# Patient Record
Sex: Male | Born: 1990 | Race: Black or African American | Hispanic: No | Marital: Single | Smoking: Never smoker
Health system: Southern US, Community
[De-identification: ages and names within clinical notes are randomized; demographics above are authoritative.]

---

## 2020-02-24 ENCOUNTER — Emergency Department (HOSPITAL_COMMUNITY): Payer: Self-pay

## 2020-02-24 ENCOUNTER — Encounter (HOSPITAL_COMMUNITY): Payer: Self-pay | Admitting: Emergency Medicine

## 2020-02-24 ENCOUNTER — Other Ambulatory Visit: Payer: Self-pay

## 2020-02-24 ENCOUNTER — Emergency Department (HOSPITAL_COMMUNITY)
Admission: EM | Admit: 2020-02-24 | Discharge: 2020-02-24 | Disposition: A | Discharge status: Home / Self Care | Payer: Self-pay | Attending: Emergency Medicine | Admitting: Emergency Medicine

## 2020-02-24 DIAGNOSIS — M25572 Pain in left ankle and joints of left foot: Secondary | ICD-10-CM | POA: Insufficient documentation

## 2020-02-24 DIAGNOSIS — R030 Elevated blood-pressure reading, without diagnosis of hypertension: Secondary | ICD-10-CM

## 2020-02-24 MED ORDER — IBUPROFEN 400 MG PO TABS
400.0000 mg | ORAL_TABLET | Freq: Once | ORAL | Status: AC
  Administered 2020-02-24: 400 mg via ORAL
  Filled 2020-02-24: qty 1

## 2020-02-24 MED ORDER — IBUPROFEN 600 MG PO TABS: 600.0000 mg | ORAL_TABLET | Freq: Three times a day (TID) | ORAL | 0 refills | Status: AC | PRN

## 2020-02-24 NOTE — Discharge Instructions (Addendum)
It was our pleasure to provide your ER care today - we hope that you feel better.  Take ibuprofen as need for pain.   Follow up with primary care doctor in 1 week - also have your blood pressure rechecked then, as it is high today.  Return to ER if worse, new symptoms, fevers, increased swelling, spreading redness, severe pain, or other concern.

## 2020-02-24 NOTE — ED Triage Notes (Signed)
Pt reports bilateral ankle pain. Pt reports left more than right. Pt denies any known injury. No obvious deformity noted. Pt able to bear weight and ambulate with steady gait.

## 2020-02-24 NOTE — ED Provider Notes (Signed)
[facility] EMERGENCY DEPARTMENT Provider Note   CSN: [id number] Arrival date & time: 02/24/20  1008     History Chief Complaint  Patient presents with  . Ankle Pain    Terry Walton is a 29 y.o. male.  Patient c/o left ankle pain in the past month. Symptoms gradual onset, moderate, constant, dull, non radiating. States at times feels worse if up and walking a lot in a day. Denies specific injury. No acute/abrupt worsening today. No increased redness or swelling to area. Denies hx arthritis or gout. No other joint pain. No fever or chills. Indicates does not feel sick or ill. No fevers. No foot numbness or weakness.   The history is provided by the patient.  Ankle Pain Associated symptoms: no fever        History reviewed. No pertinent past medical history.  There are no problems to display for this patient.   History reviewed. No pertinent surgical history.     History reviewed. No pertinent family history.  Social History   Tobacco Use  . Smoking status: Never Smoker  . Smokeless tobacco: Never Used  Substance Use Topics  . Alcohol use: Yes    Comment: occ  . Drug use: Not on file    Home Medications Prior to Admission medications   Not on File    Allergies    Patient has no allergy information on record.  Review of Systems   Review of Systems  Constitutional: Negative for chills and fever.  Respiratory: Negative for shortness of breath.   Cardiovascular: Negative for chest pain.  Gastrointestinal: Negative for abdominal pain.  Genitourinary: Negative for dysuria.  Musculoskeletal:       Ankle pain  Skin: Negative for rash and wound.  Neurological: Negative for weakness and numbness.    Physical Exam Updated Vital Signs BP (!) 171/83 (BP Location: Right Arm)   Pulse 68   Temp 99.8 F (37.7 C) (Oral)   Resp 18   Ht 1.702 m (5\' 7" )   Wt 104.3 kg   SpO2 100%   BMI 36.02 kg/m   Physical Exam Vitals and nursing note reviewed.   Constitutional:      Appearance: Normal appearance. He is well-developed.  HENT:     Head: Atraumatic.     Nose: Nose normal.     Mouth/Throat:     Mouth: Mucous membranes are moist.  Eyes:     General: No scleral icterus.    Conjunctiva/sclera: Conjunctivae normal.  Neck:     Trachea: No tracheal deviation.  Cardiovascular:     Rate and Rhythm: Normal rate.     Pulses: Normal pulses.  Pulmonary:     Effort: Pulmonary effort is normal. No accessory muscle usage or respiratory distress.  Abdominal:     General: There is no distension.  Genitourinary:    Comments: No cva tenderness. Musculoskeletal:     Cervical back: Neck supple.     Comments: Tenderness left ankle and proximal foot dorsally. Dp/pt 2+. Normal cap refill distally in toes. Foot of normal color and warmth. No significant sts noted. No pain or tenderness to heel or plantar region. No proximal tib fib or lower leg pain or swelling. Skin in area appears normal.   Skin:    General: Skin is warm and dry.     Findings: No rash.  Neurological:     Mental Status: He is alert.     Comments: Alert, speech clear. Left foot nvi. Steady gait.  Psychiatric:        Mood and Affect: Mood normal.     ED Results / Procedures / Treatments   Labs (all labs ordered are listed, but only abnormal results are displayed) Labs Reviewed - No data to display  EKG None  Radiology DG Ankle Complete Left  Result Date: 02/24/2020 CLINICAL DATA:  29 year old complaining of LEFT ankle pain. No known injuries. EXAM: LEFT ANKLE COMPLETE - 3+ VIEW COMPARISON:  None. FINDINGS: No evidence of acute fracture. Tibiotalar joint anatomically aligned with well-preserved joint space. Well-preserved bone mineral density. Very small enthesopathic spur at the insertion of the Achilles tendon on the POSTERIOR calcaneus. No visible joint effusion. IMPRESSION: No acute or significant osseous abnormality. Electronically Signed   By: Hulan Saas M.D.    On: 02/24/2020 12:10    Procedures Procedures (including critical care time)  Medications Ordered in ED Medications  ibuprofen (ADVIL) tablet 400 mg (has no administration in time range)    ED Course  I have reviewed the triage vital signs and the nursing notes.  Pertinent labs & imaging results that were available during my care of the patient were reviewed by me and considered in my medical decision making (see chart for details).    MDM Rules/Calculators/A&P                          Xrays.   No meds pta today. Ibuprofen po.   Reviewed nursing notes and prior charts for additional history.   Xrays reviewed/interpreted by me - no fx. Discussed w pt. On exam, no redness, no increased warmth, no pain w passive rom at ankle.   Pt appears stable for d/c.  RX nsaid.   Rec pcp f/u, as well as for recheck bp as high today.  Return precautions provided.    Final Clinical Impression(s) / ED Diagnoses Final diagnoses:  None    Rx / DC Orders ED Discharge Orders    None       Cathren Laine, MD 02/24/20 1224

## 2022-05-07 IMAGING — DX DG ANKLE COMPLETE 3+V*L*
3 series · 3 of 3 positions shown · non-contrast
Comparison: None.

CLINICAL DATA: 29-year-old complaining of LEFT ankle pain. No known
injuries.

EXAM:
LEFT ANKLE COMPLETE - 3+ VIEW

[ankle ap]
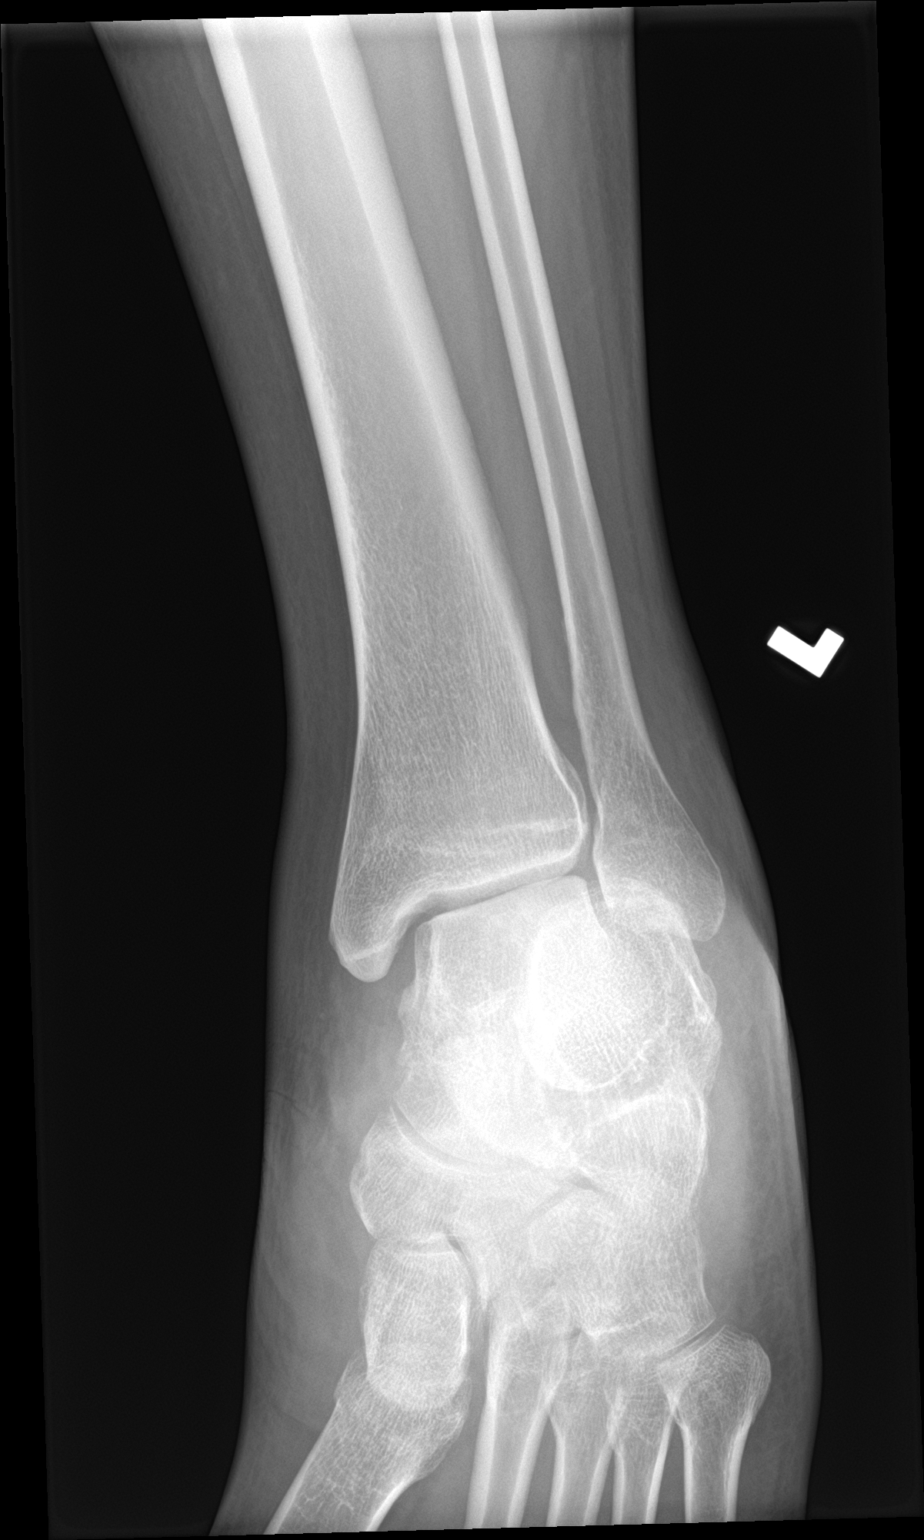

[ankle obl]
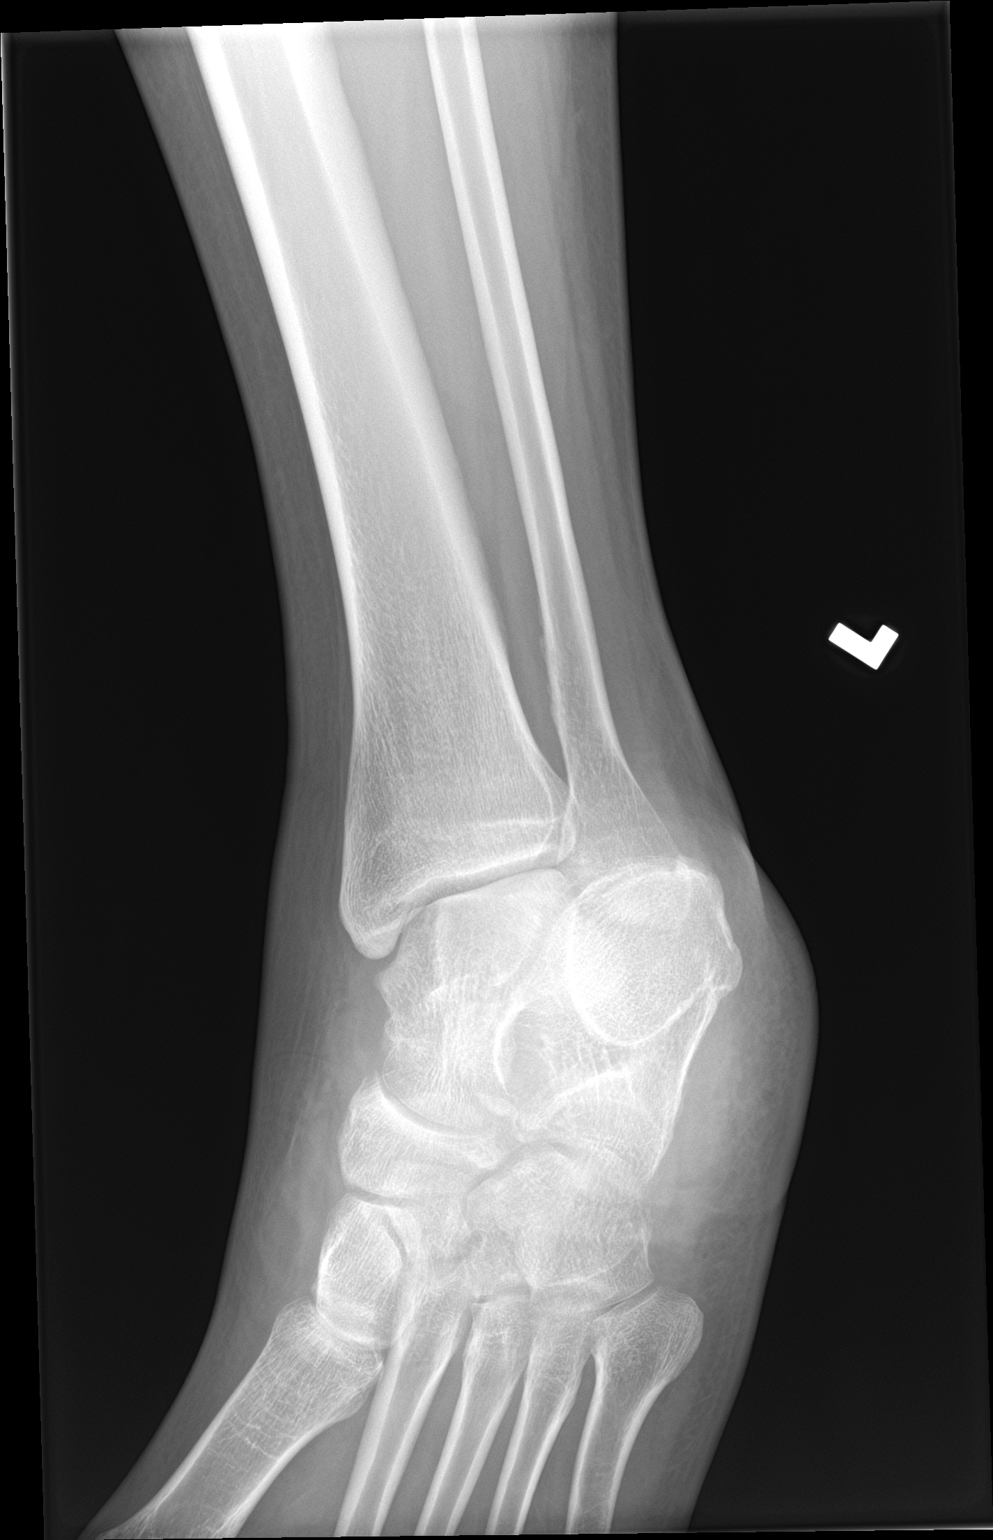

[ankle lat]
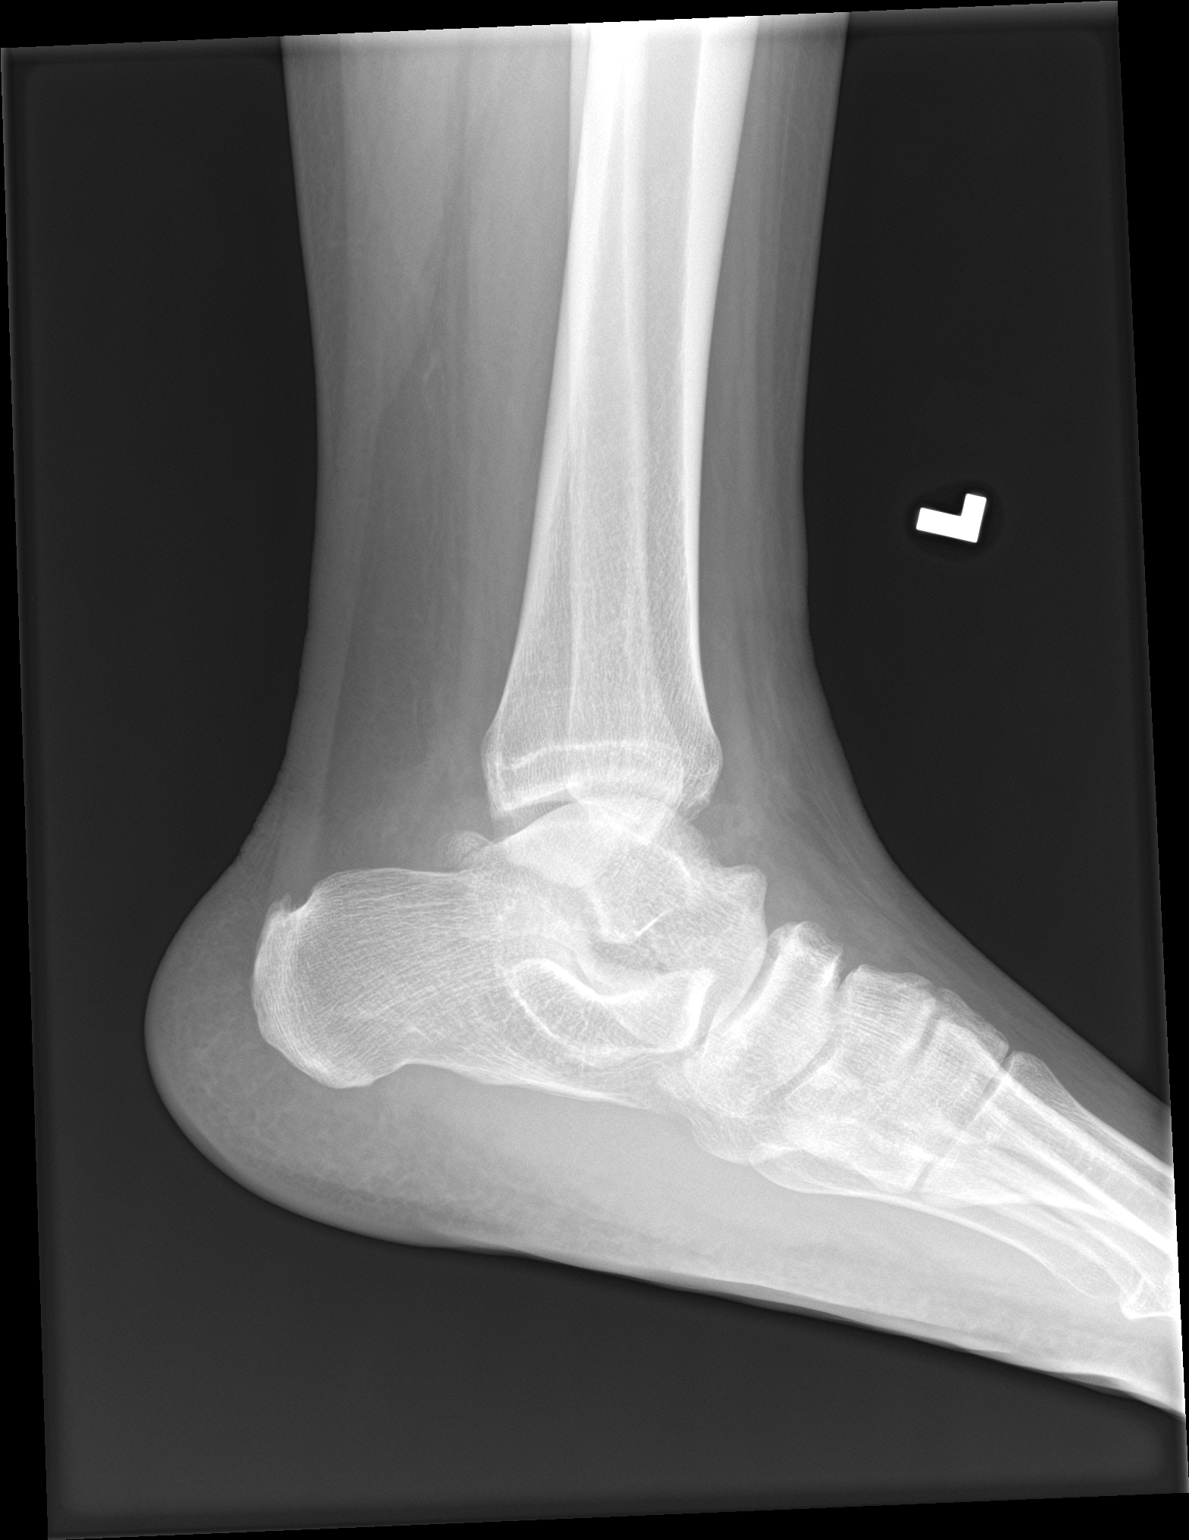

[3 of 3 positions shown; findings below may reference images not displayed]

FINDINGS: No evidence of acute fracture. Tibiotalar joint anatomically aligned
with well-preserved joint space. Well-preserved bone mineral
density. Very small enthesopathic spur at the insertion of the
Achilles tendon on the POSTERIOR calcaneus. No visible joint
effusion.
IMPRESSION: No acute or significant osseous abnormality.
